# Patient Record
Sex: Male | Born: 1995 | Race: White | Hispanic: No | Marital: Married | Smoking: Never smoker
Health system: Southern US, Community
[De-identification: ages and names within clinical notes are randomized; demographics above are authoritative.]

---
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
## 2013-04-11 ENCOUNTER — Ambulatory Visit: Payer: Self-pay | Admitting: Family Medicine

## 2013-04-11 ENCOUNTER — Ambulatory Visit (INDEPENDENT_AMBULATORY_CARE_PROVIDER_SITE_OTHER): Payer: Managed Care, Other (non HMO) | Admitting: Family Medicine

## 2013-04-11 ENCOUNTER — Encounter: Payer: Self-pay | Admitting: Family Medicine

## 2013-04-11 VITALS — BP 122/82 | HR 60 | Temp 98.4°F | Wt 170.5 lb

## 2013-04-11 DIAGNOSIS — R05 Cough: Secondary | ICD-10-CM

## 2013-04-11 DIAGNOSIS — J209 Acute bronchitis, unspecified: Secondary | ICD-10-CM | POA: Insufficient documentation

## 2013-04-11 MED ORDER — AZITHROMYCIN 250 MG PO TABS: ORAL_TABLET | ORAL | Status: DC

## 2013-04-11 NOTE — Patient Instructions (Signed)
Treat with zpack today. May continue codeine cough syrup as needed. Continue flovent daily and albuterol as needed. Let us know if not improved with this. Lots of fluids and rest.

## 2013-04-11 NOTE — Assessment & Plan Note (Signed)
Given duration of illness, progression of cough, and lack of improvement, will treat with zpack. Supportive care as per instructions. Pt agrees with plan.

## 2013-04-11 NOTE — Progress Notes (Signed)
  Subjective:    Patient ID: Troy Henry, male    DOB: May 11, 1996, 17 y.o.   MRN: [id number]  HPI CC: 3wk h/o cough  Cough for last 3 weeks.  Rhinorrhea for last few days.  Cough worse at night.  Having coughing fits, some diaphoresis with this.  No recent fevers/chills, HA, abd pain, ST, ear or tooth pain.  No congestion, sneezing or watery eyes.  Has tried robitussin at home. Using flovent every morning - not really helping.  Taking albuterol 30 min before exercise.  H/o EIA.  Uses albuterol No smokers at home. Dad sick at home recently with similar prolonged cough. UTD immunizations.  History reviewed. No pertinent past medical history.   Review of Systems Per HPI    Objective:   Physical Exam  Nursing note and vitals reviewed. Constitutional: He appears well-developed and well-nourished. No distress.  HENT:  Head: Normocephalic and atraumatic.  Right Ear: Hearing, tympanic membrane, external ear and ear canal normal.  Left Ear: Hearing, tympanic membrane, external ear and ear canal normal.  Nose: Nose normal. No mucosal edema or rhinorrhea. Right sinus exhibits no maxillary sinus tenderness and no frontal sinus tenderness. Left sinus exhibits no maxillary sinus tenderness and no frontal sinus tenderness.  Mouth/Throat: Uvula is midline, oropharynx is clear and moist and mucous membranes are normal. No oropharyngeal exudate, posterior oropharyngeal edema, posterior oropharyngeal erythema or tonsillar abscesses.  nasal congestion  Eyes: Conjunctivae and EOM are normal. Pupils are equal, round, and reactive to light. No scleral icterus.  Neck: Normal range of motion. Neck supple.  Cardiovascular: Normal rate, regular rhythm, normal heart sounds and intact distal pulses.   No murmur heard. Pulmonary/Chest: Effort normal and breath sounds normal. No respiratory distress. He has no wheezes. He has no rales.  Lymphadenopathy:    He has no cervical adenopathy.  Skin: Skin is  warm and dry. No rash noted.       Assessment & Plan:

## 2013-07-29 ENCOUNTER — Ambulatory Visit: Payer: Managed Care, Other (non HMO) | Admitting: Family Medicine

## 2013-07-29 ENCOUNTER — Ambulatory Visit (INDEPENDENT_AMBULATORY_CARE_PROVIDER_SITE_OTHER): Payer: Managed Care, Other (non HMO) | Admitting: Family Medicine

## 2013-07-29 ENCOUNTER — Encounter: Payer: Self-pay | Admitting: Family Medicine

## 2013-07-29 VITALS — BP 116/80 | HR 63 | Temp 98.2°F | Ht 69.5 in | Wt 176.2 lb

## 2013-07-29 DIAGNOSIS — H6691 Otitis media, unspecified, right ear: Secondary | ICD-10-CM

## 2013-07-29 DIAGNOSIS — H6690 Otitis media, unspecified, unspecified ear: Secondary | ICD-10-CM

## 2013-07-29 DIAGNOSIS — J029 Acute pharyngitis, unspecified: Secondary | ICD-10-CM

## 2013-07-29 LAB — POCT RAPID STREP A (OFFICE): Rapid Strep A Screen: NEGATIVE

## 2013-07-29 MED ORDER — AMOXICILLIN 500 MG PO CAPS: 1000.0000 mg | ORAL_CAPSULE | Freq: Two times a day (BID) | ORAL | Status: DC

## 2013-07-29 NOTE — Patient Instructions (Addendum)
Tylenol or ibuprofen for pain and fever. Complete antibiotics.  Can use mucinex DM if symptoms.

## 2013-07-29 NOTE — Assessment & Plan Note (Signed)
Treat with antibiotics. Symptom care. Call if not improving as expected, call sooner if fever continues after 24-48 hours.

## 2013-07-29 NOTE — Progress Notes (Signed)
  Subjective:    Patient ID: Troy Henry, male    DOB: 1996/04/22, 17 y.o.   MRN: [id number]  Sinusitis This is a new problem. The current episode started in the past 7 days. The problem has been gradually worsening since onset. Maximum temperature: subjective fever. The fever has been present for less than 1 day. He is experiencing no pain. Pertinent negatives include no sinus pressure. (Dry cough, post nasal drip,no facial pain, no sinus pain, B ear pain, runny nose) Treatments tried: motrin, benadryl. The treatment provided mild relief.   Non smoker.  Hx of sports induced asthma... No requirement of albuterol.    Review of Systems  HENT: Negative for sinus pressure.   Cardiovascular: Negative for chest pain.  Gastrointestinal: Negative for abdominal pain.       Objective:   Physical Exam  Constitutional: Vital signs are normal. He appears well-developed and well-nourished.  Non-toxic appearance. He does not appear ill. No distress.  HENT:  Head: Normocephalic and atraumatic.  Right Ear: Hearing, external ear and ear canal normal. No tenderness. No foreign bodies. Tympanic membrane is injected, erythematous and bulging. A middle ear effusion is present.  Left Ear: Hearing, tympanic membrane, external ear and ear canal normal.  Nose: Nose normal. No mucosal edema or rhinorrhea. Right sinus exhibits no maxillary sinus tenderness and no frontal sinus tenderness. Left sinus exhibits no maxillary sinus tenderness and no frontal sinus tenderness.  Mouth/Throat: Uvula is midline, oropharynx is clear and moist and mucous membranes are normal. Normal dentition. No dental caries. No oropharyngeal exudate or tonsillar abscesses.  Left tm occluded by cerumen  Eyes: Conjunctivae, EOM and lids are normal. Pupils are equal, round, and reactive to light. Lids are everted and swept, no foreign bodies found.  Neck: Trachea normal, normal range of motion and phonation normal. Neck supple. Carotid  bruit is not present. No mass and no thyromegaly present.  Cardiovascular: Normal rate, regular rhythm, S1 normal, S2 normal, normal heart sounds, intact distal pulses and normal pulses.  Exam reveals no gallop.   No murmur heard. Pulmonary/Chest: Effort normal and breath sounds normal. No respiratory distress. He has no wheezes. He has no rhonchi. He has no rales.  Abdominal: Soft. Normal appearance and bowel sounds are normal. There is no hepatosplenomegaly. There is no tenderness. There is no rebound, no guarding and no CVA tenderness. No hernia.  Lymphadenopathy:    He has cervical adenopathy.       Right cervical: Superficial cervical adenopathy present.       Left cervical: Superficial cervical adenopathy present.  Neurological: He is alert. He has normal reflexes.  Skin: Skin is warm, dry and intact. No rash noted.  Psychiatric: He has a normal mood and affect. His speech is normal and behavior is normal. Judgment normal.          Assessment & Plan:

## 2013-11-10 ENCOUNTER — Encounter: Payer: Self-pay | Admitting: Family Medicine

## 2013-11-10 ENCOUNTER — Ambulatory Visit (INDEPENDENT_AMBULATORY_CARE_PROVIDER_SITE_OTHER): Payer: Managed Care, Other (non HMO) | Admitting: Family Medicine

## 2013-11-10 VITALS — BP 112/70 | HR 75 | Temp 98.3°F | Ht 69.0 in | Wt 186.5 lb

## 2013-11-10 DIAGNOSIS — J019 Acute sinusitis, unspecified: Secondary | ICD-10-CM

## 2013-11-10 DIAGNOSIS — H6591 Unspecified nonsuppurative otitis media, right ear: Secondary | ICD-10-CM

## 2013-11-10 DIAGNOSIS — H6590 Unspecified nonsuppurative otitis media, unspecified ear: Secondary | ICD-10-CM

## 2013-11-10 MED ORDER — AMOXICILLIN-POT CLAVULANATE 875-125 MG PO TABS: 1.0000 | ORAL_TABLET | Freq: Two times a day (BID) | ORAL | Status: DC

## 2013-11-10 NOTE — Progress Notes (Signed)
Patient Name: Troy Henry D Rigsby Date of Birth: February 27, 1996 Medical Record Number: [id number]  History of Present Illness:  Patent presents with runny nose, sneezing, cough, sore throat, malaise and minimal / low-grade fever for > 1 week. Now the primary complaint has become sinus pressure and pain behind the eyes and in the upper, anterior face. Some ear pain on R  The patent denies sore throat as the primary complaint. Denies sthortness of breath/wheezing, high fever, chest pain, significant myalgia, otalgia, abdominal pain, changes in bowel or bladder.  PMH, PHS, Allergies, Problem List, Medications, Family History, and Social History have all been reviewed.  Patient Active Problem List   Diagnosis Date Noted  . Acute bronchitis 04/11/2013  . Right otitis media 06/18/2012    No past medical history on file.  No past surgical history on file.  History   Social History  . Marital Status: Married    Spouse Name: N/A    Number of Children: N/A  . Years of Education: N/A   Occupational History  . Not on file.   Social History Main Topics  . Smoking status: Never Smoker   . Smokeless tobacco: Never Used  . Alcohol Use: No  . Drug Use: No  . Sexual Activity: Not on file   Other Topics Concern  . Not on file   Social History Narrative  . No narrative on file    No family history on file.  No Known Allergies  Medication list reviewed and updated in full in [place] [facility].  Review of Systems: as above, eating and drinking - tolerating PO. Urinating normally. No excessive vomitting or diarrhea. O/w as above.  Physical Exam:  Filed Vitals:   11/10/13 1436  BP: 112/70  Pulse: 75  Temp: 98.3 F (36.8 C)  TempSrc: Oral  Height: 5\' 9"  (1.753 m)  Weight: 186 lb 8 oz (84.596 kg)    GEN: WDWN, Non-toxic, Atraumatic, normocephalic. A and O x 3. HEENT: Oropharynx clear without exudate, MMM, no significant LAD, mild rhinnorhea Sinuses: Right Frontal, ethmoid, and  maxillary: Tender Left Frontal, Ethmoid, and maxillary: Tender Ears: R TM BULGING AND RED, L, COL visualized with good landmarks CV: RRR, no m/g/r. Pulm: CTA B, no wheezes, rhonchi, or crackles, normal respiratory effort. EXT: no c/c/e Psych: well oriented, neither depressed nor anxious in appearance  Assessment and Plan: Acute Sinusitis  Acute sinusitis: ABX as below.   Reviewed symptomatic care as well as ABX in this case.    Meds ordered this encounter  Medications  . amoxicillin-clavulanate (AUGMENTIN) 875-125 MG per tablet    Sig: Take 1 tablet by mouth 2 (two) times daily.    Dispense:  20 tablet    Refill:  0    Patient Instructions Reviewed: SINUSITIS Sinuses are cavities in facial skeleton that drain to nose. Impaired drainage and obstruction of sinus passages main cause.  Treatment: 1. Take all Antibiotics 2. Open nasal and sinus canals: Oral decongestant: Sudafed. (CAUTION IF HIGH BLOOD PRESSURE) 3. Steam inhalation 4. Humidifier in room 5. Frequent nasal saline irrigation 6. Moist heat compresses to face 7. Tylenol or Ibuprofen for pain and fever, follow directions on bottle.  Otitis Media -Middle Ear Infection  Fluid builds up in middle ear Pressure builds up and causes discomfort Can be caused by viruses or bacteria  Treatment: 1. Relieve ear pressure: Oral decongestants -- Sudafed (pseudoephedrine or phenylephrine): CAUTION if HIGH BLOOD PRESSURE Nasal Decongestants: Afrin nasal spray can help. But don't use more  than 3 days in a row. 2. Take prescribed antibiotics, take the whole bottle until empty   Signed,  Rohith Fauth T. Harman Langhans, MD, CAQ Sports Medicine  [employer] [facility] [address] [place] [address] Phone: [phone] Fax: [phone]

## 2013-11-10 NOTE — Progress Notes (Signed)
Pre-visit discussion using our clinic review tool. No additional management support is needed unless otherwise documented below in the visit note.  

## 2014-01-06 ENCOUNTER — Ambulatory Visit (INDEPENDENT_AMBULATORY_CARE_PROVIDER_SITE_OTHER): Payer: Managed Care, Other (non HMO) | Admitting: Internal Medicine

## 2014-01-06 ENCOUNTER — Encounter: Payer: Self-pay | Admitting: Internal Medicine

## 2014-01-06 VITALS — BP 118/80 | HR 81 | Temp 98.4°F | Wt 184.5 lb

## 2014-01-06 DIAGNOSIS — J029 Acute pharyngitis, unspecified: Secondary | ICD-10-CM

## 2014-01-06 DIAGNOSIS — J020 Streptococcal pharyngitis: Secondary | ICD-10-CM

## 2014-01-06 DIAGNOSIS — K1370 Unspecified lesions of oral mucosa: Secondary | ICD-10-CM

## 2014-01-06 DIAGNOSIS — K121 Other forms of stomatitis: Secondary | ICD-10-CM

## 2014-01-06 LAB — POCT RAPID STREP A (OFFICE): Rapid Strep A Screen: NEGATIVE

## 2014-01-06 NOTE — Addendum Note (Signed)
Addended by: Roena MaladyEVONTENNO, Othon Guardia Y on: 01/06/2014 04:16 PM   Modules accepted: Orders

## 2014-01-06 NOTE — Progress Notes (Signed)
HPI  Pt presents to the clinic today with c/o runny nose and sore throat. He reports this started 2-3 days ago. He does have pain with swallowing. He does have a dry cough.  He denies fever, chills or body aches. He has taken OTC ibuprofen and Mucinex OTC.  Review of Systems     History reviewed. No pertinent past medical history.  History reviewed. No pertinent family history.  History   Social History  . Marital Status: Married    Spouse Name: N/A    Number of Children: N/A  . Years of Education: N/A   Occupational History  . Not on file.   Social History Main Topics  . Smoking status: Never Smoker   . Smokeless tobacco: Never Used  . Alcohol Use: No  . Drug Use: No  . Sexual Activity: Not on file   Other Topics Concern  . Not on file   Social History Narrative  . No narrative on file    No Known Allergies   Constitutional: Denies headache, fatigue, fever or abrupt weight changes.  HEENT:  Positive sore throat. Denies eye redness, eye pain, pressure behind the eyes, facial pain, nasal congestion, ear pain, ringing in the ears, wax buildup, runny nose or bloody nose. Respiratory: Positive cough. Denies difficulty breathing or shortness of breath.  Cardiovascular: Denies chest pain, chest tightness, palpitations or swelling in the hands or feet.   No other specific complaints in a complete review of systems (except as listed in HPI above).  Objective:   BP 118/80  Pulse 81  Temp(Src) 98.4 F (36.9 C) (Oral)  Wt 184 lb 8 oz (83.689 kg)  SpO2 98% Wt Readings from Last 3 Encounters:  01/06/14 184 lb 8 oz (83.689 kg) (86%*, Z = 1.06)  11/10/13 186 lb 8 oz (84.596 kg) (87%*, Z = 1.13)  07/29/13 176 lb 4 oz (79.946 kg) (81%*, Z = 0.87)   * Growth percentiles are based on CDC 2-20 Years data.     General: Appears his stated age, well developed, well nourished in NAD. HEENT: Head: normal shape and size; Eyes: sclera white, no icterus, conjunctiva pink, PERRLA  and EOMs intact; Ears: Tm's gray and intact, normal light reflex; Nose: mucosa pink and moist, septum midline; Throat/Mouth: + PND. Teeth present, mucosa erythematous and moist, ulcer noted on left tonsillar pillar, no exudate noted, no lesions or ulcerations noted.  Neck: Mild cervical lymphadenopathy. Neck supple, trachea midline. No massses, lumps or thyromegaly present.  Cardiovascular: Normal rate and rhythm. S1,S2 noted.  No murmur, rubs or gallops noted. No JVD or BLE edema. No carotid bruits noted. Pulmonary/Chest: Normal effort and positive vesicular breath sounds. No respiratory distress. No wheezes, rales or ronchi noted.      Assessment & Plan:   Acute Pharyngitis secondary to oral ulcer:  Get some rest and drink plenty of water Do salt water gargles for the sore throat Ibuprofen OTC for pain/inflammation  RTC as needed or if symptoms persist.

## 2014-01-06 NOTE — Progress Notes (Signed)
Pre visit review using our clinic review tool, if applicable. No additional management support is needed unless otherwise documented below in the visit note. 

## 2014-01-06 NOTE — Patient Instructions (Addendum)
Oral Ulcers °Oral ulcers are painful, shallow sores around the lining of the mouth. They can affect the gums, the inside of the lips and the cheeks (sores on the outside of the lips and on the face are different). They typically first occur in school aged children and teenagers. Oral ulcers may also be called canker sores or cold sores. °CAUSES  °Canker sores and cold sores can be caused by many factors including: °· Infection. °· Injury. °· Sun exposure. °· Medications. °· Emotional stress. °· Food allergies. °· Vitamin deficiencies. °· Toothpastes containing sodium lauryl sulfate. °The Herpes Virus can be the cause of mouth ulcers. The first infection can be severe and cause 10 or more ulcers on the gums, tongue and lips with fever and difficulty in swallowing. This infection usually occurs between the ages of 1 and 3 years.  °SYMPTOMS  °The typical sore is about ¼ inch (6 mm) in size, is an oval or round ulcer with red borders. °DIAGNOSIS  °Your caregiver can diagnose simple oral ulcers by examination. Additional testing is usually not required.  °TREATMENT  °Treatment is aimed at pain relief. Generally, oral ulcers resolve by themselves within 1 to 2 weeks without medication and are not contagious unless caused by Herpes (and other viruses). Antibiotics are not effective with mouth sores. Avoid direct contact with others until the ulcer is completely healed. See your caregiver for follow-up care as recommended. Also: °· Offer a soft diet. °· Encourage plenty of fluids to prevent dehydration. Popsicles and milk shakes can be helpful. °· Avoid acidic and salty foods and drinks such as orange juice. °· Infants and young children will often refuse to drink because of pain. Using a teaspoon, cup or syringe to give small amounts of fluids frequently can help prevent dehydration. °· Cold compresses on the face may help reduce pain. °· Pain medication can help control soreness. °· A solution of diphenhydramine mixed  with a liquid antacid can be useful to decrease the soreness of ulcers. Consult a caregiver for the dosing. °· Liquids or ointments with a numbing ingredient may be helpful when used as recommended. °· Older children and teenagers can rinse their mouth with a salt-water mixture (1/2 teaspoonof salt in 8 ounces of water) four times a day. This treatment is uncomfortable but may reduce the time the ulcers are present. °· There are many over the counter throat lozenges and medications available for oral ulcers. There effectiveness has not been studied. °· Consult your medical caregiver prior to using homeopathic treatments for oral ulcers. °SEEK MEDICAL CARE IF:  °· You think your child needs to be seen. °· The pain worsens and you cannot control it. °· There are 4 or more ulcers. °· The lips and gums begin to bleed and crust. °· A single mouth ulcer is near a tooth that is causing a toothache or pain. °· Your child has a fever, swollen face, or swollen glands. °· The ulcers began after starting a medication. °· Mouth ulcers keep re-occurring or last more than 2 weeks. °· You think your child is not taking adequate fluids. °SEEK IMMEDIATE MEDICAL CARE IF:  °· Your child has a high fever. °· Your child is unable to swallow or becomes dehydrated. °· Your child looks or acts very ill. °· An ulcer caused by a chemical your child accidentally put in their mouth. °Document Released: 11/30/2004 Document Revised: 01/15/2012 Document Reviewed: 07/15/2009 °ExitCare® Patient Information ©2014 [employer]. ° °

## 2014-04-30 ENCOUNTER — Ambulatory Visit (INDEPENDENT_AMBULATORY_CARE_PROVIDER_SITE_OTHER): Payer: Managed Care, Other (non HMO) | Admitting: Family Medicine

## 2014-04-30 ENCOUNTER — Ambulatory Visit: Payer: Managed Care, Other (non HMO) | Admitting: Family Medicine

## 2014-04-30 ENCOUNTER — Encounter: Payer: Self-pay | Admitting: Family Medicine

## 2014-04-30 VITALS — BP 120/74 | HR 66 | Temp 98.6°F | Ht 69.0 in | Wt 181.0 lb

## 2014-04-30 DIAGNOSIS — B9789 Other viral agents as the cause of diseases classified elsewhere: Secondary | ICD-10-CM

## 2014-04-30 DIAGNOSIS — J029 Acute pharyngitis, unspecified: Secondary | ICD-10-CM

## 2014-04-30 DIAGNOSIS — B349 Viral infection, unspecified: Secondary | ICD-10-CM

## 2014-04-30 LAB — POCT RAPID STREP A (OFFICE): Rapid Strep A Screen: NEGATIVE

## 2014-04-30 NOTE — Progress Notes (Signed)
   [address] [place] [address] Phone: [phone] Fax: [phone]  Patient ID: Troy Henry MRN: [id number], DOB: [birth year], 18 y.o. Date of Encounter: 04/30/2014  Primary Physician:  Hannah BeatSpencer Lumir Demetriou, MD   Chief Complaint: Fever, Otalgia and Sore Throat   Subjective:   History of Present Illness:  Troy Henry is a 18 y.o. very pleasant male patient who presents with the following:  Few days sore throat, ears, some congestion, no significant cough.  Past Medical History, Surgical History, Social History, Family History, Problem List, Medications, and Allergies have been reviewed and updated if relevant.  Review of Systems: ROS: GEN: Acute illness details above GI: Tolerating PO intake GU: maintaining adequate hydration and urination Pulm: No SOB Interactive and getting along well at home.  Otherwise, ROS is as per the HPI.   Objective:   Physical Examination: BP 120/74  Pulse 66  Temp(Src) 98.6 F (37 C) (Oral)  Ht 5\' 9"  (1.753 m)  Wt 181 lb (82.101 kg)  BMI 26.72 kg/m2   Gen: WDWN, NAD; A & O x3, cooperative. Pleasant.Globally Non-toxic HEENT: Normocephalic and atraumatic. Throat clear, w/o exudate, R TM clear, L TM - good landmarks, No fluid present. rhinnorhea.  MMM Frontal sinuses: NT Max sinuses: NT NECK: Anterior cervical  LAD is absent CV: RRR, No M/G/R, cap refill <2 sec PULM: Breathing comfortably in no respiratory distress. no wheezing, crackles, rhonchi EXT: No c/c/e PSYCH: Friendly, good eye contact MSK: Nml gait     Laboratory and Imaging Data: Results for orders placed in visit on 04/30/14  POCT RAPID STREP A (OFFICE)      Result Value Ref Range   Rapid Strep A Screen Negative  Negative     Assessment & Plan:   Viral syndrome  Sore throat - Plan: POCT rapid strep A  Supportive care  New Prescriptions   No medications on file   Modified Medications   No medications on file   Orders Placed This Encounter    Procedures  . POCT rapid strep A   Follow-up: No Follow-up on file. Unless noted above, the patient is to follow-up if symptoms worsen. Red flags were reviewed with the patient.  Signed,  Elpidio GaleaSpencer T. Starlin Steib, MD, CAQ Sports Medicine   Discontinued Medications   DIPHENHYDRAMINE (BENADRYL) 25 MG TABLET    Take 25 mg by mouth at bedtime as needed for itching.   Current Medications at Discharge:   Medication List       This list is accurate as of: 04/30/14 11:52 AM.  Always use your most recent med list.               albuterol 108 (90 BASE) MCG/ACT inhaler  Commonly known as:  PROVENTIL HFA;VENTOLIN HFA  2 puffs 30 minutes prior to exercise     fluticasone 110 MCG/ACT inhaler  Commonly known as:  FLOVENT HFA  Inhale 1 puff into the lungs 2 (two) times daily.     ibuprofen 200 MG tablet  Commonly known as:  ADVIL,MOTRIN  Take 600 mg by mouth every 6 (six) hours as needed for pain.

## 2014-04-30 NOTE — Progress Notes (Signed)
Pre visit review using our clinic review tool, if applicable. No additional management support is needed unless otherwise documented below in the visit note. 

## 2014-05-05 ENCOUNTER — Encounter: Payer: Self-pay | Admitting: Internal Medicine

## 2014-05-05 ENCOUNTER — Ambulatory Visit (INDEPENDENT_AMBULATORY_CARE_PROVIDER_SITE_OTHER): Payer: Managed Care, Other (non HMO) | Admitting: Internal Medicine

## 2014-05-05 VITALS — BP 110/60 | HR 69 | Temp 98.4°F | Wt 180.0 lb

## 2014-05-05 DIAGNOSIS — J209 Acute bronchitis, unspecified: Secondary | ICD-10-CM | POA: Insufficient documentation

## 2014-05-05 DIAGNOSIS — J45901 Unspecified asthma with (acute) exacerbation: Secondary | ICD-10-CM | POA: Insufficient documentation

## 2014-05-05 MED ORDER — AMOXICILLIN 500 MG PO TABS: 1000.0000 mg | ORAL_TABLET | Freq: Two times a day (BID) | ORAL | Status: DC

## 2014-05-05 MED ORDER — PREDNISONE 20 MG PO TABS: 40.0000 mg | ORAL_TABLET | Freq: Every day | ORAL | Status: DC

## 2014-05-05 NOTE — Assessment & Plan Note (Signed)
Tight cough. Voice is off also Will treat with prednisone for 5 days Reviewed his meds---I think he is taking them incorrectly Also needs a spacer

## 2014-05-05 NOTE — Progress Notes (Signed)
   Subjective:    Patient ID: Troy Henry, male    DOB: [birth year], 18 y.o.   MRN: [id number]  HPI Still sick from last week Sore throat persisted over the weekend Has been sick for 2 weeks now Now having increased cough Occasionally coughing up sputum (swallowing)  No SOB Had fever for 3 days---better now Has had some night sweats--even a little last night. Worse before then  Some ear pain 2 days ago--right No headache  Tried mucinex, ibuprofen, benedryl (for sleep and itchy throat)--- slight help  Current Outpatient Prescriptions on File Prior to Visit  Medication Sig Dispense Refill  . albuterol (PROVENTIL HFA;VENTOLIN HFA) 108 (90 BASE) MCG/ACT inhaler 2 puffs 30 minutes prior to exercise  1 Inhaler  5  . fluticasone (FLOVENT HFA) 110 MCG/ACT inhaler Inhale 1 puff into the lungs 2 (two) times daily.  1 Inhaler  12  . ibuprofen (ADVIL,MOTRIN) 200 MG tablet Take 600 mg by mouth every 6 (six) hours as needed for pain.       No current facility-administered medications on file prior to visit.    No Known Allergies  No past medical history on file.  No past surgical history on file.  No family history on file.  History   Social History  . Marital Status: Married    Spouse Name: N/A    Number of Children: N/A  . Years of Education: N/A   Occupational History  . Not on file.   Social History Main Topics  . Smoking status: Never Smoker   . Smokeless tobacco: Never Used  . Alcohol Use: No  . Drug Use: No  . Sexual Activity: Not on file   Other Topics Concern  . Not on file   Social History Narrative  . No narrative on file   Review of Systems No rash No vomiting or diarrhea     Objective:   Physical Exam  Constitutional: He appears well-developed and well-nourished. No distress.  Frequent coarse, tight cough  HENT:  No sinus tenderness Right TM slightly retracted, left obscured with cerumen Moderate nasal congestion Slight pharyngeal injection    Neck: Normal range of motion. Neck supple. No thyromegaly present.  Pulmonary/Chest: Effort normal and breath sounds normal. No respiratory distress. He has no wheezes. He has no rales.  Fairly normal exp phase  Lymphadenopathy:    He has no cervical adenopathy.          Assessment & Plan:

## 2014-05-05 NOTE — Patient Instructions (Signed)
You should be taking the flovent (fluticasone) inhaler regularly every day and the albuterol only before exercise or as needed. Please use both of them with a spacer device to improve the medicine getting to your lungs. Rinse your mouth after you use the inhalers, especially the fluticasone

## 2014-05-05 NOTE — Assessment & Plan Note (Signed)
Has been sick for 2 weeks Will try empiric Rx with amoxil

## 2014-09-23 ENCOUNTER — Ambulatory Visit (INDEPENDENT_AMBULATORY_CARE_PROVIDER_SITE_OTHER)
Admission: RE | Admit: 2014-09-23 | Discharge: 2014-09-23 | Disposition: A | Discharge status: Home / Self Care | Payer: Managed Care, Other (non HMO) | Source: Ambulatory Visit | Attending: Family Medicine | Admitting: Family Medicine

## 2014-09-23 ENCOUNTER — Encounter: Payer: Self-pay | Admitting: Family Medicine

## 2014-09-23 ENCOUNTER — Ambulatory Visit (INDEPENDENT_AMBULATORY_CARE_PROVIDER_SITE_OTHER): Payer: Managed Care, Other (non HMO) | Admitting: Family Medicine

## 2014-09-23 VITALS — BP 102/70 | HR 56 | Temp 98.9°F | Ht 69.0 in | Wt 188.0 lb

## 2014-09-23 DIAGNOSIS — T148XXA Other injury of unspecified body region, initial encounter: Secondary | ICD-10-CM

## 2014-09-23 DIAGNOSIS — M25561 Pain in right knee: Secondary | ICD-10-CM

## 2014-09-23 DIAGNOSIS — T148 Other injury of unspecified body region: Secondary | ICD-10-CM

## 2014-09-23 NOTE — Progress Notes (Signed)
Dr. Karleen HampshireSpencer T. Darran Gabay, MD, CAQ Sports Medicine Primary Care and Sports Medicine [address] [place], [address] Phone: [phone] Fax: [phone]  09/23/2014  Patient: Troy Henry, MRN: [id number], DOB: September 03, 1996, 18 y.o.  Primary Physician:  Hannah BeatSpencer Kathyrn Warmuth, MD  Chief Complaint: Knee Pain  Subjective:   Troy Henry is a 18 y.o. very pleasant male patient who presents with the following:  DOI 09/06/2014  2-3 weeks ago, slid on grass and bruised knee really pretty bad. Played soccer, ripped through 3 layers of pants.  Hurts medially.   He has had some swelling in his knee, and this is mostly been medially.  It still hurts to palpate.  He also has some pain when he tries to play sports and when he tries to run.  He has not had any locking up of his knee or any buckling.  Past Medical History, Surgical History, Social History, Family History, Problem List, Medications, and Allergies have been reviewed and updated if relevant.  GEN: No fevers, chills. Nontoxic. Primarily MSK c/o today. MSK: Detailed in the HPI GI: tolerating PO intake without difficulty Neuro: No numbness, parasthesias, or tingling associated. Otherwise the pertinent positives of the ROS are noted above.   Objective:   BP 102/70 mmHg  Pulse 56  Temp(Src) 98.9 F (37.2 C) (Oral)  Ht 5\' 9"  (1.753 m)  Wt 188 lb (85.276 kg)  BMI 27.75 kg/m2   GEN: WDWN, NAD, Non-toxic, Alert & Oriented x 3 HEENT: Atraumatic, Normocephalic.  Ears and Nose: No external deformity. EXTR: No clubbing/cyanosis/edema NEURO: Normal gait.  PSYCH: Normally interactive. Conversant. Not depressed or anxious appearing.  Calm demeanor.    RIGHT knee: Minimal effusion.  Full extension.  Flexion to 130.  Nontender on the medial lateral joint lines.  Stable anterior cruciate ligament, medial collateral ligament, PCL, and lateral collateral ligament.  Nontender along the patellar tendon.  Patient mostly is tender and a  swollen area around the medial aspect of the tibial plateau.  There is some soft tissue swelling as well.  McMurray's is negative and flexion pinch test is negative.  Radiology: Dg Knee Complete 4 Views Right  09/23/2014   CLINICAL DATA:  Injured knee 2 weeks ago with pain medially  EXAM: RIGHT KNEE - COMPLETE 4+ VIEW  COMPARISON:  None.  FINDINGS: The right knee joint spaces appear normal for age. No fracture is seen. No joint effusion is noted.  IMPRESSION: Negative.   Electronically Signed   By: Dwyane DeePaul  Barry M.D.   On: 09/23/2014 15:16     Assessment and Plan:   Right knee pain - Plan: DG Knee Complete 4 Views Right  Bone bruise  Reassurance.  Bone bruise and soft tissue bruise.  Anticipate 2-3 more weeks, and he should be better.  Limited impact and running for now  Follow-up: No Follow-up on file.  New Prescriptions   No medications on file   Orders Placed This Encounter  Procedures  . DG Knee Complete 4 Views Right    Signed,  Karleen HampshireSpencer T. Johnluke Haugen, MD   Patient's Medications  New Prescriptions   No medications on file  Previous Medications   ALBUTEROL (PROVENTIL HFA;VENTOLIN HFA) 108 (90 BASE) MCG/ACT INHALER    2 puffs 30 minutes prior to exercise   FLUTICASONE (FLOVENT HFA) 110 MCG/ACT INHALER    Inhale 1 puff into the lungs 2 (two) times daily.   IBUPROFEN (ADVIL,MOTRIN) 200 MG TABLET    Take 600 mg by mouth  every 6 (six) hours as needed for pain.  Modified Medications   No medications on file  Discontinued Medications   AMOXICILLIN (AMOXIL) 500 MG TABLET    Take 2 tablets (1,000 mg total) by mouth 2 (two) times daily.   PREDNISONE (DELTASONE) 20 MG TABLET    Take 2 tablets (40 mg total) by mouth daily.

## 2014-09-23 NOTE — Progress Notes (Signed)
Pre visit review using our clinic review tool, if applicable. No additional management support is needed unless otherwise documented below in the visit note. 

## 2015-07-02 IMAGING — CR DG KNEE COMPLETE 4+V*R*
5 series · 5 of 5 positions shown · non-contrast
Comparison: None.

CLINICAL DATA: Injured knee 2 weeks ago with pain medially

EXAM:
RIGHT KNEE - COMPLETE 4+ VIEW

[view not recorded (1 of 5)]
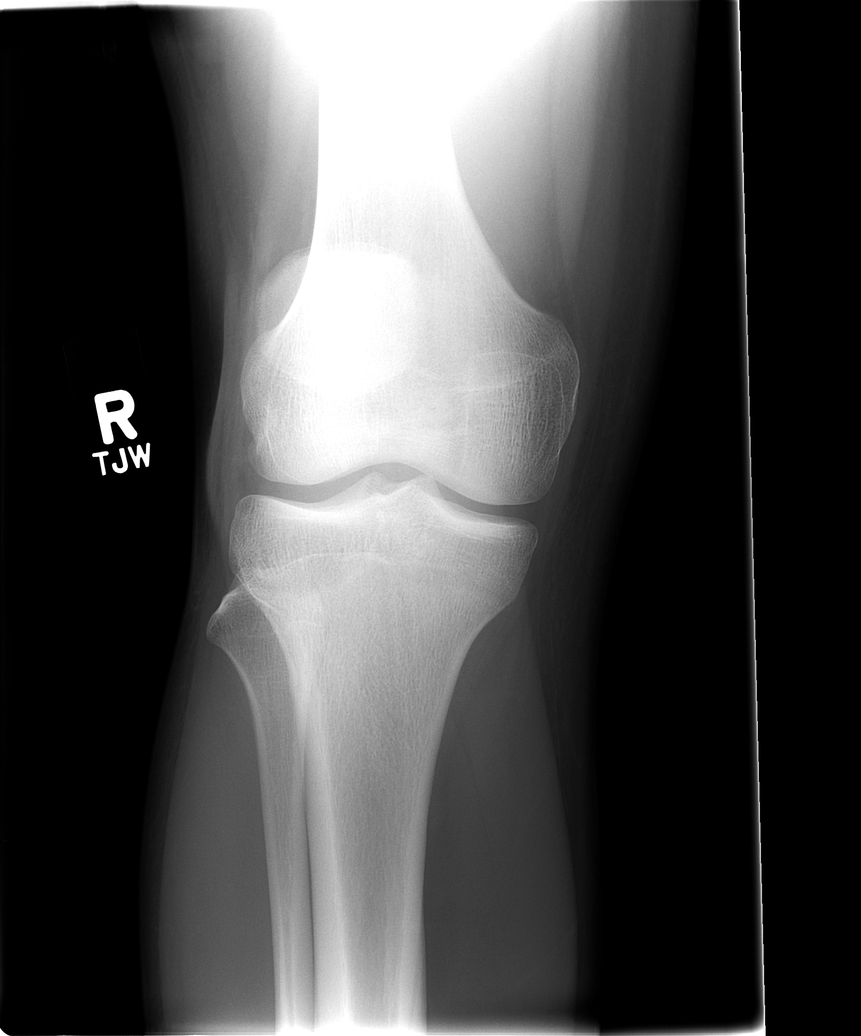

[view not recorded (2 of 5)]
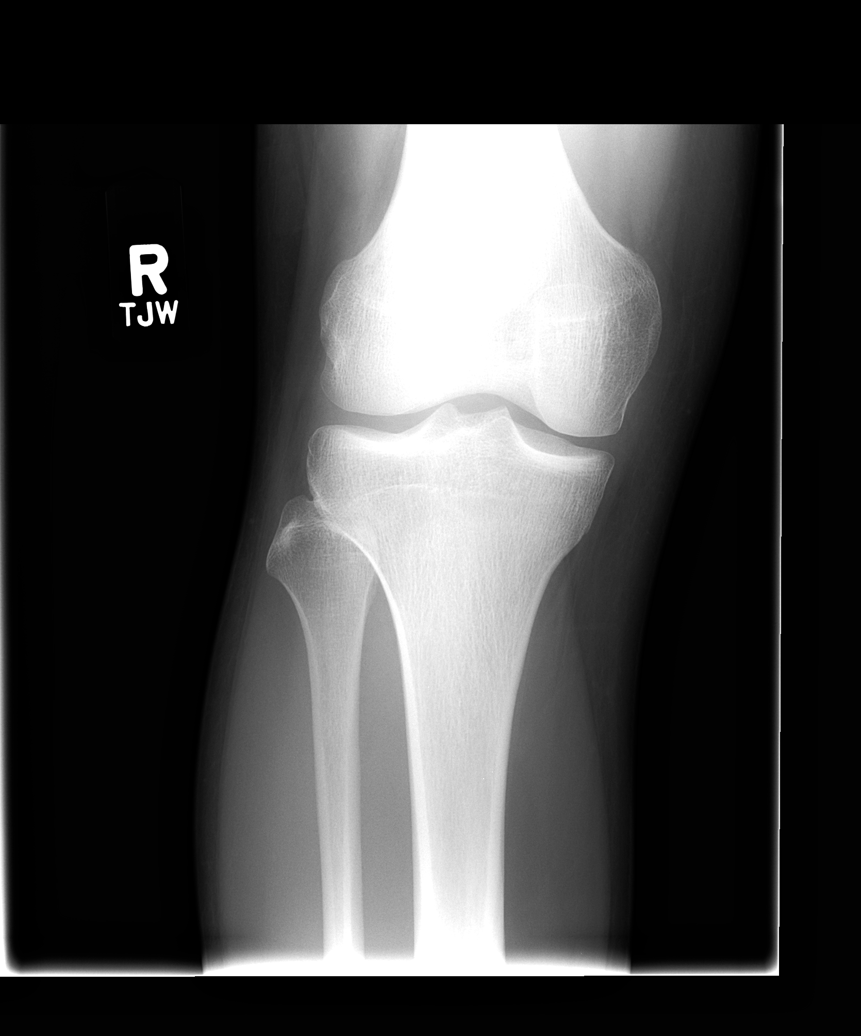

[view not recorded (3 of 5)]
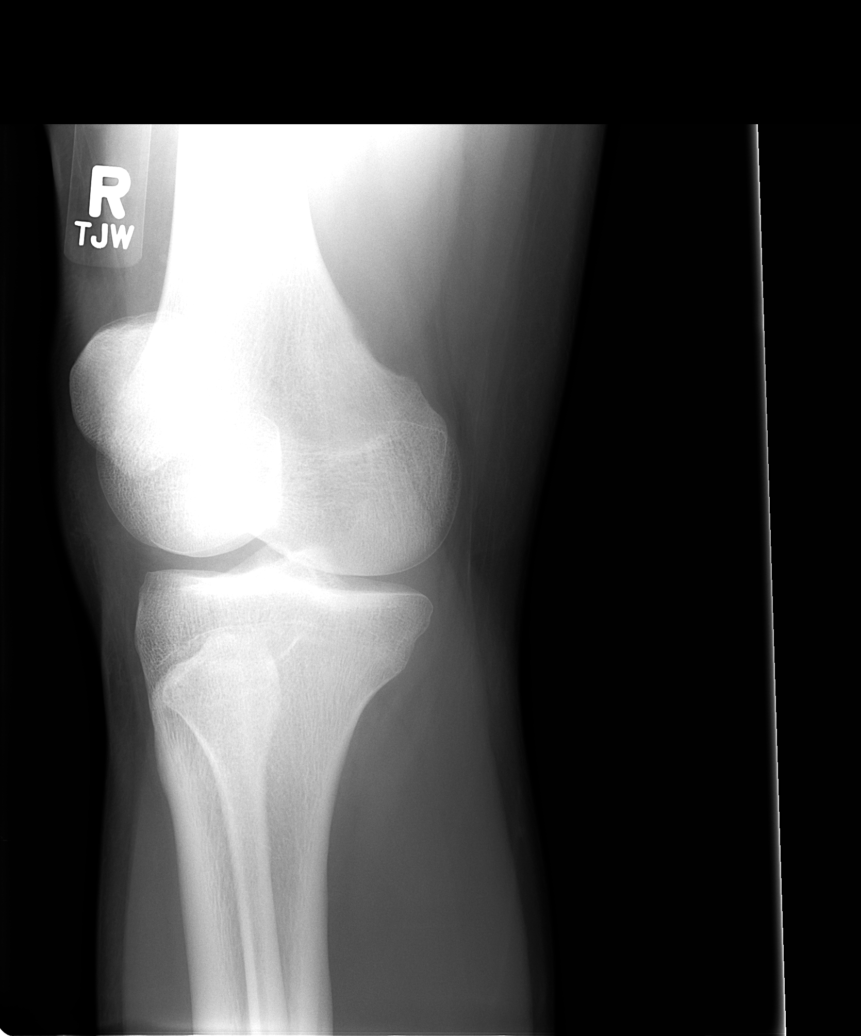

[view not recorded (4 of 5)]
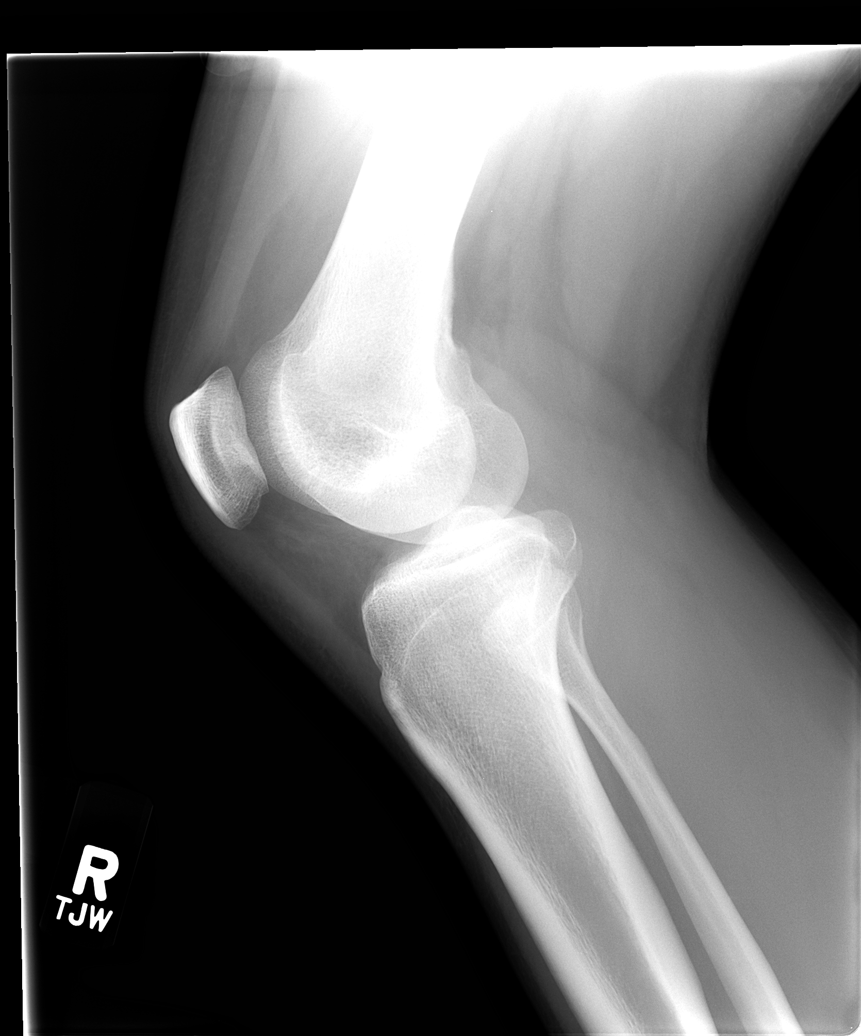

[view not recorded (5 of 5)]
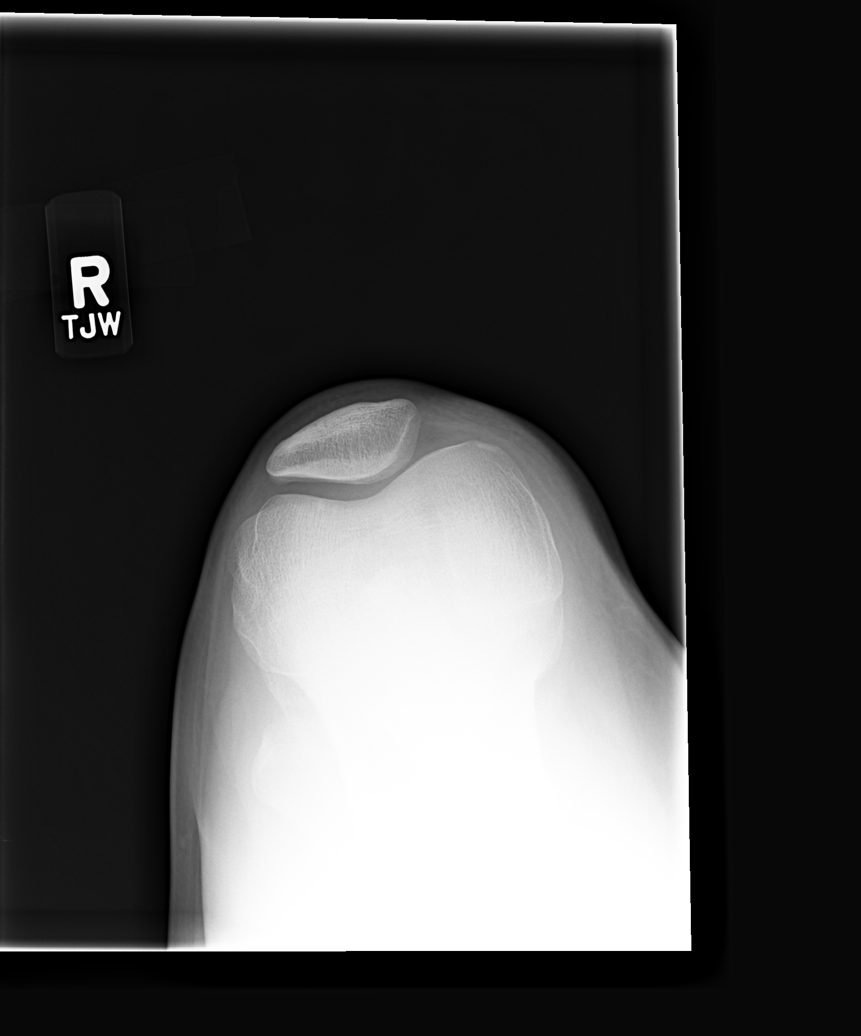

[5 of 5 positions shown; findings below may reference images not displayed]

FINDINGS: The right knee joint spaces appear normal for age. No fracture is
seen. No joint effusion is noted.
IMPRESSION: Negative.

## 2015-09-22 ENCOUNTER — Telehealth: Payer: Self-pay | Admitting: Family Medicine

## 2015-09-22 NOTE — Telephone Encounter (Signed)
Paper charts have been requested from storage. Left message for Mrs. Schmiesing to return my call.

## 2015-09-22 NOTE — Telephone Encounter (Signed)
Pt's mother called back, she said she got the message from Lupita LeashDonna about no immunization records on file. She said she was confused as to why because pt has been seen here for the past 12 years and has had his shots. Please call het back at [phone].

## 2015-09-22 NOTE — Telephone Encounter (Addendum)
No immunization record available in our chart or [place] [facility] registry.  Mrs. Wands notified and advised to check with where he went to high school to see if they have a copy of his immunization record.

## 2015-09-22 NOTE — Telephone Encounter (Signed)
Request all paper charts.   Make sure not in centricity or scanned.

## 2015-09-22 NOTE — Telephone Encounter (Signed)
Patient's mother is calling to get a copy of patient's immunization records for college.

## 2015-09-22 NOTE — Telephone Encounter (Signed)
I don't know what else I can tell her.  There is no Immunization records in [place] or [place] [place].  I guess we can request their paper charts but there no guarantee they are in there either.  Please advise.

## 2015-09-27 NOTE — Telephone Encounter (Signed)
Left message for Mrs. Apel that [facility]'s paper chart was pulled from storage and there was an immunization [place].  Copy left up front for her to pick up.

## 2015-10-13 ENCOUNTER — Ambulatory Visit (INDEPENDENT_AMBULATORY_CARE_PROVIDER_SITE_OTHER): Payer: Managed Care, Other (non HMO) | Admitting: *Deleted

## 2015-10-13 DIAGNOSIS — Z23 Encounter for immunization: Secondary | ICD-10-CM

## 2016-04-04 ENCOUNTER — Ambulatory Visit (INDEPENDENT_AMBULATORY_CARE_PROVIDER_SITE_OTHER): Payer: Managed Care, Other (non HMO) | Admitting: Family Medicine

## 2016-04-04 ENCOUNTER — Encounter: Payer: Self-pay | Admitting: Family Medicine

## 2016-04-04 VITALS — BP 110/82 | HR 67 | Temp 98.5°F | Wt 192.5 lb

## 2016-04-04 DIAGNOSIS — J209 Acute bronchitis, unspecified: Secondary | ICD-10-CM

## 2016-04-04 MED ORDER — AZITHROMYCIN 250 MG PO TABS: ORAL_TABLET | ORAL | Status: AC

## 2016-04-04 NOTE — Progress Notes (Signed)
Pre visit review using our clinic review tool, if applicable. No additional management support is needed unless otherwise documented below in the visit note.  Sx started about 1 week ago.  ST initially, some l ear sx.  Then took some otc cold medicine. ST continued through the weekend.  ST better, then cough started in the meantime.  On flovent at baseline.  Lost his voice recently.  Some wheeze.  No FCNAVD now.  May have had a fever initially but not in the last few days.  No sputum usually, mainly dry cough.  Had not used SABA recently.  No rash.  He may be a little better overall compared to a few days ago.    Meds, vitals, and allergies reviewed.   ROS: Per HPI unless specifically indicated in ROS section   GEN: nad, alert and oriented HEENT: mucous membranes moist, R tm w/o erythema, L TM with cerumen removed with curette w/o complication- L TM looks slightly irritated superiorly but o/w wnl- doesn't appear infected, nasal exam w/o erythema, clear discharge noted,  OP with cobblestoning, sinuses not ttp NECK: supple w/o LA CV: rrr.   PULM: ctab, no inc wob EXT: no edema SKIN: no acute rash

## 2016-04-04 NOTE — Patient Instructions (Signed)
Drink plenty of fluids, take ibuprofen as needed, and gargle with warm salt water for your throat.   Restart albuterol in the meantime for the cough.  This should gradually improve.  Take care.  Let us know if you have other concerns.   If worse in the meantime, then start the antibiotics and update us.

## 2016-04-05 NOTE — Assessment & Plan Note (Signed)
Presumed, but mild sx and benign exam.  D/w pt.  Advised to: Drink plenty of fluids, take ibuprofen as needed, and gargle with warm salt water for your throat.  Restart albuterol in the meantime for the cough.  This should gradually improve.  Let us know if you have other concerns.  If worse in the meantime, then start the antibiotics and update us.  In meantime, hold zmax rx.  He agrees.   Likely viral.

## 2016-05-05 ENCOUNTER — Ambulatory Visit (INDEPENDENT_AMBULATORY_CARE_PROVIDER_SITE_OTHER): Payer: Managed Care, Other (non HMO) | Admitting: *Deleted

## 2016-05-05 DIAGNOSIS — Z111 Encounter for screening for respiratory tuberculosis: Secondary | ICD-10-CM

## 2016-05-08 LAB — TB SKIN TEST
Induration: 0 mm
TB SKIN TEST: NEGATIVE
